# Patient Record
Sex: Female | Born: 1990
Health system: Southern US, Community
[De-identification: ages and names within clinical notes are randomized; demographics above are authoritative.]

## PROBLEM LIST (undated history)

## (undated) DIAGNOSIS — R55 Syncope and collapse: Secondary | ICD-10-CM

## (undated) HISTORY — DX: Syncope and collapse: R55

---
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
## 2009-02-23 ENCOUNTER — Other Ambulatory Visit: Admission: RE | Admit: 2009-02-23 | Discharge: 2009-02-23 | Payer: Self-pay | Admitting: Obstetrics and Gynecology

## 2010-03-24 ENCOUNTER — Other Ambulatory Visit: Admission: RE | Admit: 2010-03-24 | Discharge: 2010-03-24 | Payer: Self-pay | Admitting: Obstetrics and Gynecology

## 2011-01-26 LAB — CBC
HCT: 41.1
Hemoglobin: 11.3 — ABNORMAL LOW
MCHC: 34.5
Platelets: 167
RBC: 3.59 — ABNORMAL LOW
RBC: 4.58
WBC: 10.6 — ABNORMAL HIGH

## 2011-01-26 LAB — RPR: RPR Ser Ql: NONREACTIVE

## 2016-04-21 DIAGNOSIS — H6691 Otitis media, unspecified, right ear: Secondary | ICD-10-CM | POA: Diagnosis not present

## 2016-05-16 DIAGNOSIS — R51 Headache: Secondary | ICD-10-CM | POA: Diagnosis not present

## 2016-06-05 DIAGNOSIS — H9201 Otalgia, right ear: Secondary | ICD-10-CM | POA: Diagnosis not present

## 2016-07-27 DIAGNOSIS — H6980 Other specified disorders of Eustachian tube, unspecified ear: Secondary | ICD-10-CM | POA: Diagnosis not present

## 2016-07-27 DIAGNOSIS — J309 Allergic rhinitis, unspecified: Secondary | ICD-10-CM | POA: Diagnosis not present

## 2016-07-27 DIAGNOSIS — K219 Gastro-esophageal reflux disease without esophagitis: Secondary | ICD-10-CM | POA: Diagnosis not present

## 2016-09-27 DIAGNOSIS — K219 Gastro-esophageal reflux disease without esophagitis: Secondary | ICD-10-CM | POA: Diagnosis not present

## 2016-09-27 DIAGNOSIS — R197 Diarrhea, unspecified: Secondary | ICD-10-CM | POA: Diagnosis not present

## 2017-03-14 DIAGNOSIS — J309 Allergic rhinitis, unspecified: Secondary | ICD-10-CM | POA: Diagnosis not present

## 2017-03-14 DIAGNOSIS — H6981 Other specified disorders of Eustachian tube, right ear: Secondary | ICD-10-CM | POA: Diagnosis not present

## 2017-05-22 DIAGNOSIS — Z20828 Contact with and (suspected) exposure to other viral communicable diseases: Secondary | ICD-10-CM | POA: Diagnosis not present

## 2017-05-22 DIAGNOSIS — J069 Acute upper respiratory infection, unspecified: Secondary | ICD-10-CM | POA: Diagnosis not present

## 2017-05-28 DIAGNOSIS — J0100 Acute maxillary sinusitis, unspecified: Secondary | ICD-10-CM | POA: Diagnosis not present

## 2017-09-10 DIAGNOSIS — Z01419 Encounter for gynecological examination (general) (routine) without abnormal findings: Secondary | ICD-10-CM | POA: Diagnosis not present

## 2017-09-10 DIAGNOSIS — Z6823 Body mass index (BMI) 23.0-23.9, adult: Secondary | ICD-10-CM | POA: Diagnosis not present

## 2017-09-11 DIAGNOSIS — R1011 Right upper quadrant pain: Secondary | ICD-10-CM | POA: Diagnosis not present

## 2017-09-13 DIAGNOSIS — R1011 Right upper quadrant pain: Secondary | ICD-10-CM | POA: Diagnosis not present

## 2017-09-25 DIAGNOSIS — N941 Unspecified dyspareunia: Secondary | ICD-10-CM | POA: Diagnosis not present

## 2017-09-25 DIAGNOSIS — N924 Excessive bleeding in the premenopausal period: Secondary | ICD-10-CM | POA: Diagnosis not present

## 2017-12-27 DIAGNOSIS — L858 Other specified epidermal thickening: Secondary | ICD-10-CM | POA: Diagnosis not present

## 2017-12-27 DIAGNOSIS — D1801 Hemangioma of skin and subcutaneous tissue: Secondary | ICD-10-CM | POA: Diagnosis not present

## 2017-12-27 DIAGNOSIS — L720 Epidermal cyst: Secondary | ICD-10-CM | POA: Diagnosis not present

## 2017-12-27 DIAGNOSIS — D225 Melanocytic nevi of trunk: Secondary | ICD-10-CM | POA: Diagnosis not present

## 2018-04-17 DIAGNOSIS — J3089 Other allergic rhinitis: Secondary | ICD-10-CM | POA: Diagnosis not present

## 2018-04-17 DIAGNOSIS — H6981 Other specified disorders of Eustachian tube, right ear: Secondary | ICD-10-CM | POA: Insufficient documentation

## 2018-05-15 DIAGNOSIS — N390 Urinary tract infection, site not specified: Secondary | ICD-10-CM | POA: Diagnosis not present

## 2018-05-15 DIAGNOSIS — R82998 Other abnormal findings in urine: Secondary | ICD-10-CM | POA: Diagnosis not present

## 2018-05-15 DIAGNOSIS — N760 Acute vaginitis: Secondary | ICD-10-CM | POA: Diagnosis not present

## 2019-02-25 DIAGNOSIS — Z23 Encounter for immunization: Secondary | ICD-10-CM | POA: Diagnosis not present

## 2019-05-07 ENCOUNTER — Emergency Department (HOSPITAL_BASED_OUTPATIENT_CLINIC_OR_DEPARTMENT_OTHER)
Admission: EM | Admit: 2019-05-07 | Discharge: 2019-05-07 | Disposition: A | Discharge status: Home / Self Care | Payer: BC Managed Care – PPO | Attending: Emergency Medicine | Admitting: Emergency Medicine

## 2019-05-07 ENCOUNTER — Other Ambulatory Visit: Payer: Self-pay

## 2019-05-07 ENCOUNTER — Encounter (HOSPITAL_BASED_OUTPATIENT_CLINIC_OR_DEPARTMENT_OTHER): Payer: Self-pay

## 2019-05-07 DIAGNOSIS — R55 Syncope and collapse: Secondary | ICD-10-CM | POA: Insufficient documentation

## 2019-05-07 LAB — URINALYSIS, MICROSCOPIC (REFLEX)

## 2019-05-07 LAB — PREGNANCY, URINE: Preg Test, Ur: NEGATIVE

## 2019-05-07 LAB — URINALYSIS, ROUTINE W REFLEX MICROSCOPIC
Bilirubin Urine: NEGATIVE
Glucose, UA: NEGATIVE mg/dL
Ketones, ur: NEGATIVE mg/dL
Leukocytes,Ua: NEGATIVE
Nitrite: NEGATIVE
Protein, ur: NEGATIVE mg/dL
Specific Gravity, Urine: 1.01 (ref 1.005–1.030)
pH: 7 (ref 5.0–8.0)

## 2019-05-07 LAB — CBC
HCT: 46 % (ref 36.0–46.0)
Hemoglobin: 14.8 g/dL (ref 12.0–15.0)
MCH: 28 pg (ref 26.0–34.0)
MCHC: 32.2 g/dL (ref 30.0–36.0)
MCV: 87 fL (ref 80.0–100.0)
Platelets: 248 10*3/uL (ref 150–400)
RBC: 5.29 MIL/uL — ABNORMAL HIGH (ref 3.87–5.11)
RDW: 12.2 % (ref 11.5–15.5)
WBC: 5.5 10*3/uL (ref 4.0–10.5)
nRBC: 0 % (ref 0.0–0.2)

## 2019-05-07 LAB — BASIC METABOLIC PANEL
Anion gap: 8 (ref 5–15)
BUN: 7 mg/dL (ref 6–20)
CO2: 22 mmol/L (ref 22–32)
Calcium: 9.1 mg/dL (ref 8.9–10.3)
Chloride: 107 mmol/L (ref 98–111)
Creatinine, Ser: 0.7 mg/dL (ref 0.44–1.00)
GFR calc Af Amer: >60 mL/min (ref >60)
GFR calc non Af Amer: >60 mL/min (ref >60)
Glucose, Bld: 143 mg/dL — ABNORMAL HIGH (ref 70–99)
Potassium: 3 mmol/L — ABNORMAL LOW (ref 3.5–5.1)
Sodium: 137 mmol/L (ref 135–145)

## 2019-05-07 LAB — CBG MONITORING, ED: Glucose-Capillary: 129 mg/dL — ABNORMAL HIGH (ref 70–99)

## 2019-05-07 LAB — D-DIMER, QUANTITATIVE (NOT AT ARMC): D-Dimer, Quant: 0.36 ug/mL-FEU (ref 0.00–0.50)

## 2019-05-07 MED ORDER — SODIUM CHLORIDE 0.9 % IV BOLUS
1000.0000 mL | Freq: Once | INTRAVENOUS | Status: AC
  Administered 2019-05-07: 1000 mL via INTRAVENOUS

## 2019-05-07 MED ORDER — SODIUM CHLORIDE 0.9% FLUSH
3.0000 mL | Freq: Once | INTRAVENOUS | Status: AC
  Administered 2019-05-07: 3 mL via INTRAVENOUS
  Filled 2019-05-07: qty 3

## 2019-05-07 NOTE — ED Notes (Signed)
ED Provider at bedside. 

## 2019-05-07 NOTE — Progress Notes (Signed)
   Asked to review EKG on Ms. Voges for exercise induced syncope. Noted to be tachycardic with long QTc on arrival, however, told by EDP Belfi that the QTc normalized with improved rate. No history of syncope. No associated chest pain. No medications. Would recommend outpatient cardiology follow-up with any provider.  Pixie Casino, MD, [facility], [place] Director of the Advanced Lipid Disorders &  Cardiovascular Risk Reduction Clinic Diplomate of the American Board of Clinical Lipidology Attending Cardiologist  Direct Dial: [phone]  Fax: [phone]  Website:  [email or web address]

## 2019-05-07 NOTE — ED Triage Notes (Signed)
Pt states she passed out after a cardio work out ~330pm-states she recalled sitting on ottoman and woke up on the floor-pt NAD-steady gait

## 2019-05-07 NOTE — ED Provider Notes (Signed)
[facility] EMERGENCY DEPARTMENT Provider Note   CSN: [id number] Arrival date & time: 05/07/19  1708     History Chief Complaint  Patient presents with  . Loss of Consciousness     Paone is a 28 y.o. female.  Patient is a 28 year old female with no significant past medical history who presents with a syncopal episode.  She states that she was exercising for the first time in a long time.  She was doing some cardio workout.  She was doing this for about 5 to 8 minutes.  She said that after the exercise, she felt like she normally would after exercising that she was sweaty and her heart was beating fast.  However then she felt like she needed to sit down and that something was not quite right although she could not put her finger on it.  She did not really say that she got lightheaded or dizzy.  She did not feel like her heart was racing anymore than it was after she would normally exercise.  She then woke up on the floor after she had a syncopal event.  Her mother found her and said that she was only unconscious for a minute or 2.  There was not any noted seizure activity.  She woke up and she was diaphoretic.  She felt very fatigued.  She denies any prior history of issues with exercise although she says she does not exercise very much.  She occasionally feels like her heart is racing but only during anxiety attacks.  She denies any other recent illnesses.        History reviewed. No pertinent past medical history.  There are no problems to display for this patient.   History reviewed. No pertinent surgical history.   OB History   No obstetric history on file.     No family history on file.  Social History   Tobacco Use  . Smoking status: Never Smoker  . Smokeless tobacco: Never Used  Substance Use Topics  . Alcohol use: Yes    Comment: occ  . Drug use: Never    Home Medications Prior to Admission medications   Not on File    Allergies      Patient has no known allergies.  Review of Systems   Review of Systems  Constitutional: Positive for diaphoresis and fatigue. Negative for chills and fever.  HENT: Negative for congestion, rhinorrhea and sneezing.   Eyes: Negative.   Respiratory: Negative for cough, chest tightness and shortness of breath.   Cardiovascular: Positive for palpitations. Negative for chest pain and leg swelling.  Gastrointestinal: Negative for abdominal pain, blood in stool, diarrhea, nausea and vomiting.  Genitourinary: Negative for difficulty urinating, flank pain, frequency and hematuria.  Musculoskeletal: Negative for arthralgias and back pain.  Skin: Negative for rash.  Neurological: Positive for syncope. Negative for dizziness, speech difficulty, weakness, numbness and headaches.    Physical Exam Updated Vital Signs BP 131/81 (BP Location: Right Arm)   Pulse (!) 119   Temp 98.7 F (37.1 C) (Oral)   Resp 18   Ht 5\' 3"  (1.6 m)   Wt 60.8 kg   LMP 04/16/2019   SpO2 100%   BMI 23.74 kg/m   Physical Exam Constitutional:      Appearance: She is well-developed.  HENT:     Head: Normocephalic and atraumatic.  Eyes:     Pupils: Pupils are equal, round, and reactive to light.  Cardiovascular:     Rate and  Rhythm: Regular rhythm. Tachycardia present.     Heart sounds: Normal heart sounds.  Pulmonary:     Effort: Pulmonary effort is normal. No respiratory distress.     Breath sounds: Normal breath sounds. No wheezing or rales.  Chest:     Chest wall: No tenderness.  Abdominal:     General: Bowel sounds are normal.     Palpations: Abdomen is soft.     Tenderness: There is no abdominal tenderness. There is no guarding or rebound.  Musculoskeletal:        General: Normal range of motion.     Cervical back: Normal range of motion and neck supple.  Lymphadenopathy:     Cervical: No cervical adenopathy.  Skin:    General: Skin is warm and dry.     Findings: No rash.  Neurological:      General: No focal deficit present.     Mental Status: She is alert and oriented to person, place, and time.     Cranial Nerves: No cranial nerve deficit.     Sensory: No sensory deficit.     Motor: No weakness.     Coordination: Coordination normal.     ED Results / Procedures / Treatments   Labs (all labs ordered are listed, but only abnormal results are displayed) Labs Reviewed  BASIC METABOLIC PANEL - Abnormal; Notable for the following components:      Result Value   Potassium 3.0 (*)    Glucose, Bld 143 (*)    All other components within normal limits  CBC - Abnormal; Notable for the following components:   RBC 5.29 (*)    All other components within normal limits  URINALYSIS, ROUTINE W REFLEX MICROSCOPIC - Abnormal; Notable for the following components:   Hgb urine dipstick SMALL (*)    All other components within normal limits  URINALYSIS, MICROSCOPIC (REFLEX) - Abnormal; Notable for the following components:   Bacteria, UA MANY (*)    All other components within normal limits  CBG MONITORING, ED - Abnormal; Notable for the following components:   Glucose-Capillary 129 (*)    All other components within normal limits  PREGNANCY, URINE  D-DIMER, QUANTITATIVE (NOT AT [facility])  TSH    EKG EKG Interpretation  Date/Time:  Wednesday May 07 2019 18:17:32 EST Ventricular Rate:  97 PR Interval:  144 QRS Duration: 91 QT Interval:  315 QTC Calculation: 401 R Axis:   75 Text Interpretation: Sinus rhythm Borderline T abnormalities, anterior leads Confirmed by Rolan Bucco [phone]) on 05/07/2019 9:53:15 PM   Radiology No results found.  Procedures Procedures (including critical care time)  Medications Ordered in ED Medications  sodium chloride flush (NS) 0.9 % injection 3 mL (3 mLs Intravenous Given 05/07/19 1742)  sodium chloride 0.9 % bolus 1,000 mL (0 mLs Intravenous Stopped 05/07/19 1918)  sodium chloride 0.9 % bolus 1,000 mL (0 mLs Intravenous Stopped  05/07/19 2110)    ED Course  I have reviewed the triage vital signs and the nursing notes.  Pertinent labs & imaging results that were available during my care of the patient were reviewed by me and considered in my medical decision making (see chart for details).    MDM Rules/Calculators/A&P                     Patient is a 28 year old female with a history of anxiety who presents after syncopal event.  It was exercise-induced.  Her initial EKG showed tachycardia with a sinus  tachycardia at a rate of 139.  There was a prolonged QTC.  However repeat EKG showed a normal heart rate with a normal QTC.  No arrhythmias were noted.  I did speak with the cardiologist on-call who reviewed the EKGs and felt that there was not any concerning findings.  Patient can be discharged home with follow-up with cardiology.  Her labs are nonconcerning.  She was tachycardic in the ED although this resolved when patient was alone in the room and we can watch her heart on the monitor.  Her heart rate stayed in the 90s however when anyone would walk in the room her heart rate would go up again.  She does feel like this is related to anxiety as she is anxious about being here in the emergency room.  I did order a TSH.  Her D-dimer was also normal.  Her TSH is pending.  However given the heart rate normalizes when patient is calm and in the room by herself, I do not feel that she needs any further observation regarding this.  She is otherwise asymptomatic.  She was discharged home in good condition.  She was encouraged to refrain from physical exertion until cleared by cardiology.  Return precautions were given.  She was given a referral [facility] although her father sees one of the [facility]ologist here at med center and I gave the patient his contact information as well. Final Clinical Impression(s) / ED Diagnoses Final diagnoses:  Syncope and collapse    Rx / DC Orders ED Discharge  Orders    None       Rolan BuccoBelfi, Karra Pink, MD 05/07/19 2207

## 2019-05-07 NOTE — Discharge Instructions (Addendum)
Refrain from exercise or significant exertion until you are cleared by cardiology.  Follow-up with cardiology as directed.  Return here as needed if you have any worsening symptoms.

## 2019-05-08 LAB — TSH: TSH: 1.621 u[IU]/mL (ref 0.350–4.500)

## 2019-05-19 NOTE — Progress Notes (Signed)
Cardiology Office Note:    Date:  05/20/2019   ID:  Melinda Vasquez, DOB October 30, 1990, MRN [id number]  PCP:  [facility] [place]  Cardiologist:  Norman Herrlich, MD   Referring MD: [facility]  ASSESSMENT:    1. Syncope and collapse   2. Hypotension, unspecified hypotension type    PLAN:    In order of problems listed above:  1. She had a very typical postural syncopal episode related to physical activity abrupt immobilization vasodilation.  She has no evidence of underlying heart disease.  Discussed with her in the future she should gradually cool down continue to walk and should focus on hydration prior to physical activity.  We discussed further evaluation of the ambulatory heart rhythm monitor she expressed concerns about her bills from the emergency room and instead of doing that we decided that she would follow-up with me in the office in 3 months and contact me should another episode.  I do not think she requires imaging like an echocardiogram and she takes no provocative medications. 2. Not uncommon in young healthy women.  Asked her to pay close attention to hydration especially prior to physical activity gradual cooldown and add salt to her diet.  Next appointment 3 months or sooner if she has recurrent cardiovascular symptoms   Medication Adjustments/Labs and Tests Ordered: Current medicines are reviewed at length with the patient today.  Concerns regarding medicines are outlined above.  Orders Placed This Encounter  Procedures  . EKG 12-Lead   No orders of the defined types were placed in this encounter.    Chief Complaint  Patient presents with  . Loss of Consciousness  . New Patient (Initial Visit)    History of Present Illness:    Melinda Vasquez is a 29 y.o. female who is being seen today for the evaluation of syncope at the request of Dr Fredderick [facility] ED after an episode of syncope.  It occurred after physical  exercise.  Was described as brief was no seizure activity and afterwards she was described as diaphoretic and fatigued. EKG performed shows sinus tachycardia heart rate of 139 bpm nonspecific ST depression.  Can EKG showed sinus rhythm minor nonspecific ST abnormality.  Laboratory chest showed a normal D-dimer for age serum potassium 3.0 normal GFR and creatinine CBC showed a hemoglobin of 14.8 pregnancy test negative urinalysis showed many bacteria as well as white cells.  A TSH subsequently reported was normal and there is a note that there was a discussion with cardiology in view of the fact that tachycardia quickly resolved vision was made to discharge for outpatient evaluation.  Hypokalemia abnormality in the the UA were not addressed.  I reviewed her history with her she had not been exercising for many months decided to do a cardio workout on her home push herself hard when she stopped her heart was racing she stood still and started to feel weak and lightheaded.  She decided to sit down on the ottoman slumped over briefly lost consciousness did not have a seizure urinary incontinence or postictal symptoms.  She quickly recovered but found her self very apprehensive and decided to go the emergency room.  She has never had a previous episode the episode did not occur during physical effort.  She has no family history of sudden death or cardiomyopathy.  Her father is anticoagulated likely atrial fibrillation has a history of CAD.  Afterwards her baseline anxiety was worsened she said she  was quite aware of her heart in general but the symptoms have resolved and she has had no recurrent lightheadedness.  Repeat blood pressure by me in my office with proper posturing was 108/76 sitting and 98/80 standing.  Past Medical History:  Diagnosis Date  . Syncope     History reviewed. No pertinent surgical history.  Current Medications: Current Meds  Medication Sig  . cetirizine (ZYRTEC) 10 MG tablet Take  by mouth.  Marland Kitchen NUVARING 0.12-0.015 MG/24HR vaginal ring INSERT 1 RING VAGINALLY FOR 3 WEEKS REMOVE FOR 1 WEEK THEN REPEAT  . triamcinolone (NASACORT) 55 MCG/ACT AERO nasal inhaler Place into the nose as needed.     Allergies:   Patient has no known allergies.   Social History   Socioeconomic History  . Marital status: Single    Spouse name: Not on file  . Number of children: Not on file  . Years of education: Not on file  . Highest education level: Not on file  Occupational History  . Not on file  Tobacco Use  . Smoking status: Never Smoker  . Smokeless tobacco: Never Used  Substance and Sexual Activity  . Alcohol use: Yes    Comment: occ  . Drug use: Never  . Sexual activity: Not on file  Other Topics Concern  . Not on file  Social History Narrative  . Not on file   Social Determinants of Health   Financial Resource Strain:   . Difficulty of Paying Living Expenses: Not on file  Food Insecurity:   . Worried About Charity fundraiser in the Last Year: Not on file  . Ran Out of Food in the Last Year: Not on file  Transportation Needs:   . Lack of Transportation (Medical): Not on file  . Lack of Transportation (Non-Medical): Not on file  Physical Activity:   . Days of Exercise per Week: Not on file  . Minutes of Exercise per Session: Not on file  Stress:   . Feeling of Stress : Not on file  Social Connections:   . Frequency of Communication with Friends and Family: Not on file  . Frequency of Social Gatherings with Friends and Family: Not on file  . Attends Religious Services: Not on file  . Active Member of Clubs or Organizations: Not on file  . Attends Archivist Meetings: Not on file  . Marital Status: Not on file     Family History: The patient's family history includes CAD in her father.  ROS:   Review of Systems  Constitution: Negative.  HENT: Negative.   Eyes: Negative.   Cardiovascular: Positive for palpitations and syncope.  Respiratory:  Negative.   Endocrine: Negative.   Hematologic/Lymphatic: Negative.   Skin: Negative.   Musculoskeletal: Negative.   Gastrointestinal: Negative.   Genitourinary: Negative.   Neurological: Negative for seizures.  Psychiatric/Behavioral: The patient is nervous/anxious.   Allergic/Immunologic: Negative.    Please see the history of present illness.     All other systems reviewed and are negative.  EKGs/Labs/Other Studies Reviewed:    The following studies were reviewed today:   EKG:  EKG is EKG #1 in my office sinus tachycardia 132 beats per minute minor ST abnormality related to heart rate KG #2 heart rate 116 bpm normal EKG both personally reviewed and ordered today.  Recent Labs: 05/07/2019: BUN 7; Creatinine, Ser 0.70; Hemoglobin 14.8; Platelets 248; Potassium 3.0; Sodium 137; TSH 1.621  Recent Lipid Panel No results found for: CHOL, TRIG, HDL,  CHOLHDL, VLDL, LDLCALC, LDLDIRECT  Physical Exam:    VS:  BP 136/78 (BP Location: Left Arm, Patient Position: Sitting, Cuff Size: Normal)   Pulse (!) 58   Ht 5\' 3"  (1.6 m)   Wt 137 lb (62.1 kg)   SpO2 99%   BMI 24.27 kg/m     Wt Readings from Last 3 Encounters:  05/20/19 137 lb (62.1 kg)  05/07/19 134 lb (60.8 kg)     GEN:  Well nourished, well developed in no acute distress HEENT: Normal NECK: No JVD; No carotid bruits LYMPHATICS: No lymphadenopathy CARDIAC: RRR, no murmurs, rubs, gallops RESPIRATORY:  Clear to auscultation without rales, wheezing or rhonchi  ABDOMEN: Soft, non-tender, non-distended MUSCULOSKELETAL:  No edema; No deformity  SKIN: Warm and dry NEUROLOGIC:  Alert and oriented x 3 PSYCHIATRIC:  Normal affect     Signed, 05/09/19, MD  05/20/2019 10:06 AM    [facility]

## 2019-05-20 ENCOUNTER — Other Ambulatory Visit: Payer: Self-pay

## 2019-05-20 ENCOUNTER — Encounter: Payer: Self-pay | Admitting: Cardiology

## 2019-05-20 ENCOUNTER — Ambulatory Visit (INDEPENDENT_AMBULATORY_CARE_PROVIDER_SITE_OTHER): Payer: BC Managed Care – PPO | Admitting: Cardiology

## 2019-05-20 VITALS — BP 136/78 | HR 58 | Ht 63.0 in | Wt 137.0 lb

## 2019-05-20 DIAGNOSIS — I959 Hypotension, unspecified: Secondary | ICD-10-CM

## 2019-05-20 DIAGNOSIS — R55 Syncope and collapse: Secondary | ICD-10-CM

## 2019-05-20 NOTE — Patient Instructions (Signed)
Medication Instructions:  None  If you need a refill on your cardiac medications before your next appointment, please call your pharmacy*  Lab Work: None  If you have labs (blood work) drawn today and your tests are completely normal, you will receive your results only by: . MyChart Message (if you have MyChart) OR . A paper copy in the mail If you have any lab test that is abnormal or we need to change your treatment, we will call you to review the results.  Testing/Procedures: None  Follow-Up: At [facility], you and your health needs are our priority.  As part of our continuing mission to provide you with exceptional heart care, we have created designated Provider Care Teams.  These Care Teams include your primary Cardiologist (physician) and Advanced Practice Providers (APPs -  Physician Assistants and Nurse Practitioners) who all work together to provide you with the care you need, when you need it.  Your next appointment:   3 month(s)  The format for your next appointment:   In Person  Provider:   Brian Munley, MD  Other Instructions None   

## 2019-07-18 ENCOUNTER — Ambulatory Visit: Payer: BC Managed Care – PPO

## 2019-07-31 DIAGNOSIS — R202 Paresthesia of skin: Secondary | ICD-10-CM | POA: Diagnosis not present

## 2019-08-17 NOTE — Progress Notes (Signed)
Cardiology Office Note:    Date:  08/18/2019   ID:  Melinda Vasquez, DOB 09/04/1990, MRN [id number]  PCP:  [facility] [place]  Cardiologist:  Shirlee More, MD    Referring MD: [facility]    ASSESSMENT:    1. Syncope and collapse   2. Hypotension, unspecified hypotension type    PLAN:    In order of problems listed above:  1. Improved no further episodes continue to pay close attention to hydration cooling down after exercise adding salt to the diet.  I encouraged her to use products like Gatorade the day she exercises I will see her back in the office as needed in the future   Next appointment: As needed   Medication Adjustments/Labs and Tests Ordered: Current medicines are reviewed at length with the patient today.  Concerns regarding medicines are outlined above.  No orders of the defined types were placed in this encounter.  No orders of the defined types were placed in this encounter.   Chief Complaint  Patient presents with  . Follow-up    3 Months    History of Present Illness:    Melinda Vasquez is a 29 y.o. female with a hx of syncope last seen 05/20/2019.  Her clinical features are very typical of postural syncope.  She had no evidence of underlying heart disease.  She was advised to pay close attention to adequate hydration especially prior to physical activity with a gradual cooldown and with hypotension had salt to her diet and was to be seen in 3 months to assess response. Compliance with diet, lifestyle and medications: Yes  She pays close attention to hydration add salt to her diet has had no further episodes of syncope or near syncope.  She is aware at times that she notices her heart beating with stress and neither of [place] feel she needs further evaluation for arrhythmia.  She has responded well to reassurance.  She will continue avoid over-the-counter proarrhythmic drugs and I gave her a list last visit Past Medical  History:  Diagnosis Date  . Syncope     History reviewed. No pertinent surgical history.  Current Medications: Current Meds  Medication Sig  . cetirizine (ZYRTEC) 10 MG tablet Take by mouth.  Marland Kitchen NUVARING 0.12-0.015 MG/24HR vaginal ring INSERT 1 RING VAGINALLY FOR 3 WEEKS REMOVE FOR 1 WEEK THEN REPEAT  . triamcinolone (NASACORT) 55 MCG/ACT AERO nasal inhaler Place into the nose as needed.     Allergies:   Patient has no known allergies.   Social History   Socioeconomic History  . Marital status: Single    Spouse name: Not on file  . Number of children: Not on file  . Years of education: Not on file  . Highest education level: Not on file  Occupational History  . Not on file  Tobacco Use  . Smoking status: Never Smoker  . Smokeless tobacco: Never Used  Substance and Sexual Activity  . Alcohol use: Yes    Comment: occ  . Drug use: Never  . Sexual activity: Not on file  Other Topics Concern  . Not on file  Social History Narrative  . Not on file   Social Determinants of Health   Financial Resource Strain:   . Difficulty of Paying Living Expenses:   Food Insecurity:   . Worried About Charity fundraiser in the Last Year:   . Arboriculturist in the Last Year:   [employer]  Needs:   . Lack of Transportation (Medical):   Marland Kitchen Lack of Transportation (Non-Medical):   Physical Activity:   . Days of Exercise per Week:   . Minutes of Exercise per Session:   Stress:   . Feeling of Stress :   Social Connections:   . Frequency of Communication with Friends and Family:   . Frequency of Social Gatherings with Friends and Family:   . Attends Religious Services:   . Active Member of Clubs or Organizations:   . Attends Banker Meetings:   Marland Kitchen Marital Status:      Family History: The patient's family history includes CAD in her father. ROS:   Please see the history of present illness.    All other systems reviewed and are negative.  EKGs/Labs/Other Studies  Reviewed:    The following studies were reviewed today:    Recent Labs: 05/07/2019: BUN 7; Creatinine, Ser 0.70; Hemoglobin 14.8; Platelets 248; Potassium 3.0; Sodium 137; TSH 1.621  Recent Lipid Panel No results found for: CHOL, TRIG, HDL, CHOLHDL, VLDL, LDLCALC, LDLDIRECT  Physical Exam:    VS:  BP 100/70 (BP Location: Right Arm, Patient Position: Sitting, Cuff Size: Normal)   Pulse 92   Ht 5\' 3"  (1.6 m)   Wt 144 lb (65.3 kg)   SpO2 98%   BMI 25.51 kg/m     Wt Readings from Last 3 Encounters:  08/18/19 144 lb (65.3 kg)  05/20/19 137 lb (62.1 kg)  05/07/19 134 lb (60.8 kg)     GEN:  Well nourished, well developed in no acute distress HEENT: Normal NECK: No JVD; No carotid bruits LYMPHATICS: No lymphadenopathy CARDIAC: RRR, no murmurs, rubs, gallops RESPIRATORY:  Clear to auscultation without rales, wheezing or rhonchi  ABDOMEN: Soft, non-tender, non-distended MUSCULOSKELETAL:  No edema; No deformity  SKIN: Warm and dry NEUROLOGIC:  Alert and oriented x 3 PSYCHIATRIC:  Normal affect    Signed, 05/09/19, MD  08/18/2019 9:30 AM    [facility]

## 2019-08-18 ENCOUNTER — Other Ambulatory Visit: Payer: Self-pay

## 2019-08-18 ENCOUNTER — Ambulatory Visit: Payer: BC Managed Care – PPO | Admitting: Cardiology

## 2019-08-18 ENCOUNTER — Encounter: Payer: Self-pay | Admitting: Cardiology

## 2019-08-18 VITALS — BP 100/70 | HR 92 | Ht 63.0 in | Wt 144.0 lb

## 2019-08-18 DIAGNOSIS — R55 Syncope and collapse: Secondary | ICD-10-CM | POA: Diagnosis not present

## 2019-08-18 DIAGNOSIS — I959 Hypotension, unspecified: Secondary | ICD-10-CM | POA: Diagnosis not present

## 2019-08-18 NOTE — Patient Instructions (Signed)

## 2019-11-26 DIAGNOSIS — D225 Melanocytic nevi of trunk: Secondary | ICD-10-CM | POA: Diagnosis not present

## 2019-11-26 DIAGNOSIS — D2262 Melanocytic nevi of left upper limb, including shoulder: Secondary | ICD-10-CM | POA: Diagnosis not present

## 2019-11-26 DIAGNOSIS — L858 Other specified epidermal thickening: Secondary | ICD-10-CM | POA: Diagnosis not present

## 2019-11-26 DIAGNOSIS — Z113 Encounter for screening for infections with a predominantly sexual mode of transmission: Secondary | ICD-10-CM | POA: Diagnosis not present

## 2019-11-26 DIAGNOSIS — N760 Acute vaginitis: Secondary | ICD-10-CM | POA: Diagnosis not present

## 2020-01-23 DIAGNOSIS — Z6824 Body mass index (BMI) 24.0-24.9, adult: Secondary | ICD-10-CM | POA: Diagnosis not present

## 2020-01-23 DIAGNOSIS — Z113 Encounter for screening for infections with a predominantly sexual mode of transmission: Secondary | ICD-10-CM | POA: Diagnosis not present

## 2020-01-23 DIAGNOSIS — N760 Acute vaginitis: Secondary | ICD-10-CM | POA: Diagnosis not present

## 2020-01-23 DIAGNOSIS — Z01419 Encounter for gynecological examination (general) (routine) without abnormal findings: Secondary | ICD-10-CM | POA: Diagnosis not present

## 2021-01-05 ENCOUNTER — Other Ambulatory Visit: Payer: Self-pay

## 2021-01-05 ENCOUNTER — Other Ambulatory Visit: Payer: Self-pay | Admitting: Family Medicine

## 2021-01-05 ENCOUNTER — Ambulatory Visit
Admission: RE | Admit: 2021-01-05 | Discharge: 2021-01-05 | Disposition: A | Discharge status: Home / Self Care | Payer: Self-pay | Source: Ambulatory Visit | Attending: Family Medicine | Admitting: Family Medicine

## 2021-01-05 DIAGNOSIS — R52 Pain, unspecified: Secondary | ICD-10-CM

## 2021-05-18 DIAGNOSIS — Z03818 Encounter for observation for suspected exposure to other biological agents ruled out: Secondary | ICD-10-CM | POA: Diagnosis not present

## 2021-05-18 DIAGNOSIS — J019 Acute sinusitis, unspecified: Secondary | ICD-10-CM | POA: Diagnosis not present

## 2021-12-08 DIAGNOSIS — L700 Acne vulgaris: Secondary | ICD-10-CM | POA: Diagnosis not present

## 2021-12-12 DIAGNOSIS — Z6823 Body mass index (BMI) 23.0-23.9, adult: Secondary | ICD-10-CM | POA: Diagnosis not present

## 2021-12-12 DIAGNOSIS — Z01419 Encounter for gynecological examination (general) (routine) without abnormal findings: Secondary | ICD-10-CM | POA: Diagnosis not present

## 2021-12-12 DIAGNOSIS — R69 Illness, unspecified: Secondary | ICD-10-CM | POA: Diagnosis not present

## 2021-12-12 DIAGNOSIS — Z113 Encounter for screening for infections with a predominantly sexual mode of transmission: Secondary | ICD-10-CM | POA: Diagnosis not present

## 2021-12-12 DIAGNOSIS — L700 Acne vulgaris: Secondary | ICD-10-CM | POA: Diagnosis not present

## 2021-12-12 DIAGNOSIS — N898 Other specified noninflammatory disorders of vagina: Secondary | ICD-10-CM | POA: Diagnosis not present

## 2021-12-26 DIAGNOSIS — Z3202 Encounter for pregnancy test, result negative: Secondary | ICD-10-CM | POA: Diagnosis not present

## 2021-12-26 DIAGNOSIS — N760 Acute vaginitis: Secondary | ICD-10-CM | POA: Diagnosis not present

## 2022-01-04 ENCOUNTER — Ambulatory Visit (INDEPENDENT_AMBULATORY_CARE_PROVIDER_SITE_OTHER): Payer: 59 | Admitting: Radiology

## 2022-01-04 ENCOUNTER — Encounter: Payer: Self-pay | Admitting: Radiology

## 2022-01-04 VITALS — BP 114/80 | Ht 62.0 in | Wt 128.0 lb

## 2022-01-04 DIAGNOSIS — N761 Subacute and chronic vaginitis: Secondary | ICD-10-CM | POA: Diagnosis not present

## 2022-01-04 DIAGNOSIS — R69 Illness, unspecified: Secondary | ICD-10-CM | POA: Diagnosis not present

## 2022-01-04 DIAGNOSIS — N760 Acute vaginitis: Secondary | ICD-10-CM

## 2022-01-04 DIAGNOSIS — Z113 Encounter for screening for infections with a predominantly sexual mode of transmission: Secondary | ICD-10-CM

## 2022-01-04 LAB — WET PREP FOR TRICH, YEAST, CLUE

## 2022-01-04 MED ORDER — TERCONAZOLE 0.4 % VA CREA: 1.0000 | TOPICAL_CREAM | Freq: Every day | VAGINAL | 0 refills | Status: DC

## 2022-01-04 MED ORDER — TRIAMCINOLONE ACETONIDE 0.5 % EX OINT: 1.0000 | TOPICAL_OINTMENT | Freq: Two times a day (BID) | CUTANEOUS | 0 refills | Status: DC

## 2022-01-04 NOTE — Progress Notes (Signed)
      Subjective: Melinda Vasquez is a 31 y.o. female who complains of recurrent vaginal infections since 11/2021 (after taking antibiotics, was treated for yeast).  Had negative mycoplasma/ureaplasma this week at [facility].  Had negative GC/CT testing 12/12/21.  Also desires blood work for STD testing (not done at her [facility] 12/12/21 [facility]).  Wants to discuss birth control options.  Was given rx for Nuvaring at her [facility] and was supposed to insert this past Sunday (patient did not insert ring). P 01/23/20    Review of Systems  Past Medical History:  Diagnosis Date   Syncope       Objective:  Today's Vitals   01/04/22 1325  BP: 114/80  Weight: 128 lb (58.1 kg)  Height: 5\' 2"  (1.575 m)   Body mass index is 23.41 kg/m.   -General: no acute distress -Vulva: without lesions or discharge -Vagina: discharge present, aptima swab and wet prep obtained -Cervix: no lesion or discharge, no CMT -Perineum: no lesions -Uterus: Mobile, non tender -Adnexa: no masses or tenderness   Microscopic wet-mount exam shows hyphae.   Chaperone offered and declined.  Assessment:/Plan:   1. Acute vaginitis - WET PREP FOR TRICH, YEAST, CLUE - terconazole (TERAZOL 7) 0.4 % vaginal cream; Place 1 applicator vaginally at bedtime.  Dispense: 45 g; Refill: 0  2. Chronic vaginitis - SureSwab Advanced Candida Vaginitis (CV), TMA - triamcinolone ointment (KENALOG) 0.5 %; Apply 1 Application topically 2 (two) times daily.  Dispense: 30 g; Refill: 0  3. Screening for STDs (sexually transmitted diseases) - Hepatitis C antibody - HIV antibody (with reflex) - RPR    Will contact patient with results of testing completed today. Avoid intercourse until symptoms are resolved. Safe sex encouraged. Avoid the use of soaps or perfumed products in the peri area. Avoid tub baths and sitting in sweaty or wet clothing for prolonged periods of time.

## 2022-01-05 LAB — HEPATITIS C ANTIBODY: Hepatitis C Ab: NONREACTIVE

## 2022-01-05 LAB — SURESWAB® ADVANCED CANDIDA VAGINITIS (CV), TMA
CANDIDA SPECIES: NOT DETECTED
Candida glabrata: NOT DETECTED

## 2022-01-05 LAB — RPR: RPR Ser Ql: NONREACTIVE

## 2022-01-05 LAB — HIV ANTIBODY (ROUTINE TESTING W REFLEX): HIV 1&2 Ab, 4th Generation: NONREACTIVE

## 2022-01-19 ENCOUNTER — Encounter: Payer: Self-pay | Admitting: Nurse Practitioner

## 2022-01-19 ENCOUNTER — Ambulatory Visit (INDEPENDENT_AMBULATORY_CARE_PROVIDER_SITE_OTHER): Payer: 59 | Admitting: Nurse Practitioner

## 2022-01-19 VITALS — BP 112/62 | HR 106

## 2022-01-19 DIAGNOSIS — B3731 Acute candidiasis of vulva and vagina: Secondary | ICD-10-CM | POA: Diagnosis not present

## 2022-01-19 DIAGNOSIS — N760 Acute vaginitis: Secondary | ICD-10-CM

## 2022-01-19 LAB — WET PREP FOR TRICH, YEAST, CLUE

## 2022-01-19 MED ORDER — FLUCONAZOLE 150 MG PO TABS: 150.0000 mg | ORAL_TABLET | ORAL | 0 refills | Status: DC

## 2022-01-19 MED ORDER — TERCONAZOLE 0.8 % VA CREA: 1.0000 | TOPICAL_CREAM | Freq: Every day | VAGINAL | 1 refills | Status: DC

## 2022-01-19 NOTE — Progress Notes (Signed)
   Acute Office Visit  Subjective:    Patient ID: Melinda Vasquez, female    DOB: 23-Apr-1991, 31 y.o.   MRN: [id number]   HPI 31 y.o. presents today for recurrent vaginitis symptoms. Initial symptoms began in July after 2 weeks of ampicillin course for acne. Diflucan x 2 at that time. Symptoms returned - Had negative STD screening and neg yeast and BV testing 12/12/2021, neg mycoplasma/ureaplasma in August as well. Would like mycoplasma/ureaplasma rechecked because she has had ureaplasma before and symptoms are similar. Treated for yeast infection 01/04/22 with Terazol 0.4% and provided with Kenalog ointment. Symptoms improved but then when she returned from the beach she began having symptoms again. She is very frustrated and emotional today. Reports some pain with intercourse the last couple of times.    Review of Systems  Constitutional: Negative.   Genitourinary:  Positive for dyspareunia and vaginal pain (Vulvar). Negative for vaginal discharge.       Objective:    Physical Exam Constitutional:      Appearance: Normal appearance.  Genitourinary:    Vagina: No vaginal discharge or erythema.     Cervix: Normal.     Comments: Mild vulvar erythema    BP 112/62   Pulse (!) 106   LMP 12/27/2021 (Exact Date)   SpO2 98%  Wt Readings from Last 3 Encounters:  01/04/22 128 lb (58.1 kg)  08/18/19 144 lb (65.3 kg)  05/20/19 137 lb (62.1 kg)        Patient informed chaperone available to be present for breast and/or pelvic exam. Patient has requested no chaperone to be present. Patient has been advised what will be completed during breast and pelvic exam.   Wet prep + yeast  Assessment & Plan:   Problem List Items Addressed This Visit   None Visit Diagnoses     Vulvovaginal candidiasis    -  Primary   Relevant Medications   terconazole (TERAZOL 3) 0.8 % vaginal cream   fluconazole (DIFLUCAN) 150 MG tablet   Recurrent vaginitis       Relevant Orders   Mycoplasma / ureaplasma  culture   WET PREP FOR TRICH, YEAST, CLUE      Plan: Wet prep positive for yeast - Terazol 0.8% x 3 nights, then begin Diflucan 150 mg weekly x 8 weeks. Mycoplasma/ureaplasma pending. May use Kenalog as well.      Olivia Mackie DNP, 10:20 AM 01/19/2022

## 2022-01-25 ENCOUNTER — Encounter: Payer: Self-pay | Admitting: Nurse Practitioner

## 2022-01-27 LAB — MYCOPLASMA / UREAPLASMA CULTURE

## 2022-02-01 ENCOUNTER — Encounter: Payer: Self-pay | Admitting: Nurse Practitioner

## 2022-02-08 ENCOUNTER — Ambulatory Visit (INDEPENDENT_AMBULATORY_CARE_PROVIDER_SITE_OTHER): Payer: 59 | Admitting: Radiology

## 2022-02-08 VITALS — BP 110/70

## 2022-02-08 DIAGNOSIS — N761 Subacute and chronic vaginitis: Secondary | ICD-10-CM | POA: Diagnosis not present

## 2022-02-08 LAB — WET PREP FOR TRICH, YEAST, CLUE

## 2022-02-08 NOTE — Progress Notes (Signed)
      Subjective: Melinda Vasquez is a 31 y.o. female who complains of persistent vaginal infection (treated with terconazole twice and Diflucan (once a week x's 8 weeks, has used 3 weeks).  Slight vaginal itching over the weekend, no discharge.  Symptoms are more itching external labial, perineal areas. Did not find the external steroid helped at all. Using dove sensitive skin soap, taking a raw probiotic 3x/day which has improved her gut slightly.  Review of Systems  All other systems reviewed and are negative.   Past Medical History:  Diagnosis Date   Syncope       Objective:  Today's Vitals   02/08/22 1353  BP: 110/70   There is no height or weight on file to calculate BMI.   -General: no acute distress -Vulva: without lesions or discharge -Vagina: scant white discharge present, aptima swab and wet prep obtained -Cervix: no lesion or discharge, no CMT -Perineum: no lesions -Uterus: Mobile, non tender -Adnexa: no masses or tenderness   Microscopic wet-mount exam shows hyphae.   Chaperone offered and declined.  Assessment:/Plan:   1. Subacute vaginitis Boric acid nightly x 7 nights Continue probiotic Will wait for culture to return before starting another antifungal - SureSwab Advanced Candida Vaginitis (CV), TMA    Will contact patient with results of testing completed today. Avoid intercourse until symptoms are resolved. Safe sex encouraged. Avoid the use of soaps or perfumed products in the peri area. Avoid tub baths and sitting in sweaty or wet clothing for prolonged periods of time.

## 2022-02-09 LAB — SURESWAB® ADVANCED CANDIDA VAGINITIS (CV), TMA
CANDIDA SPECIES: NOT DETECTED
Candida glabrata: NOT DETECTED

## 2022-02-17 ENCOUNTER — Telehealth: Payer: Self-pay | Admitting: *Deleted

## 2022-02-17 NOTE — Telephone Encounter (Signed)
Patient called with complains of vaginal discomfort, vaginal itching. I will route to Provider for recommendations. Patient request treatment over phone

## 2022-02-17 NOTE — Telephone Encounter (Signed)
Last testing was negative. Recommend continuing probiotics using baking soda baths and coconut oil to relieve itching.

## 2022-02-17 NOTE — Telephone Encounter (Signed)
Pt aware recommedantions

## 2022-03-03 ENCOUNTER — Ambulatory Visit (INDEPENDENT_AMBULATORY_CARE_PROVIDER_SITE_OTHER): Payer: 59 | Admitting: Radiology

## 2022-03-03 ENCOUNTER — Encounter: Payer: Self-pay | Admitting: Radiology

## 2022-03-03 VITALS — BP 120/80

## 2022-03-03 DIAGNOSIS — N898 Other specified noninflammatory disorders of vagina: Secondary | ICD-10-CM | POA: Diagnosis not present

## 2022-03-03 LAB — WET PREP FOR TRICH, YEAST, CLUE

## 2022-03-03 NOTE — Progress Notes (Signed)
      Subjective: Melinda Vasquez is a 31 y.o. female who complains of some vaginal dryness/ irritation. Has struggled with recurrent infections recently, would like to confirm no infection lingering.    Review of Systems  All other systems reviewed and are negative.   Past Medical History:  Diagnosis Date   Syncope       Objective:  Today's Vitals   03/03/22 0824  BP: 120/80   There is no height or weight on file to calculate BMI.   -General: no acute distress -Vulva: without lesions or discharge -Vagina: discharge present, aptima swab and wet prep obtained -Cervix: no lesion or discharge, no CMT -Perineum: no lesions -Uterus: Mobile, non tender -Adnexa: no masses or tenderness   Microscopic wet-mount exam shows negative for pathogens, normal epithelial cells.   Chaperone offered and declined.  Assessment:/Plan:  1. Vaginal irritation Wet mt neg, reassured Coconut oil prn  Safe sex encouraged. Avoid the use of soaps or perfumed products in the peri area. Avoid tub baths and sitting in sweaty or wet clothing for prolonged periods of time.

## 2022-04-06 ENCOUNTER — Ambulatory Visit (INDEPENDENT_AMBULATORY_CARE_PROVIDER_SITE_OTHER): Payer: 59 | Admitting: Radiology

## 2022-04-06 VITALS — BP 98/68

## 2022-04-06 DIAGNOSIS — N760 Acute vaginitis: Secondary | ICD-10-CM

## 2022-04-06 LAB — WET PREP FOR TRICH, YEAST, CLUE

## 2022-04-06 MED ORDER — ESTRADIOL 0.1 MG/GM VA CREA: 1.0000 | TOPICAL_CREAM | VAGINAL | 1 refills | Status: AC

## 2022-04-06 NOTE — Progress Notes (Signed)
     Subjective: Melinda Vasquez is a 31 y.o. female who complains of some vaginal irritation x 2 weeks. Has struggled with recurrent infections recently, would like to confirm no infection lingering.    Review of Systems  All other systems reviewed and are negative.   Past Medical History:  Diagnosis Date   Syncope       Objective:  Today's Vitals   04/06/22 1350  BP: 98/68   There is no height or weight on file to calculate BMI.   -General: no acute distress -Vulva: without lesions or discharge. Very slight external erythema -Vagina: discharge present, aptima swab and wet prep obtained -Cervix: no lesion or discharge, no CMT -Perineum: no lesions -Uterus: Mobile, non tender -Adnexa: no masses or tenderness   Microscopic wet-mount exam shows negative for pathogens, normal epithelial cells.   Chaperone offered and declined.  Assessment:/Plan:  1. Acute vaginitis Reassured negative - WET PREP FOR TRICH, YEAST, CLUE  2. Vulvovaginitis Will try estrace to prevent recurrent symptoms - estradiol (ESTRACE VAGINAL) 0.1 MG/GM vaginal cream; Place 1 Applicatorful vaginally once a week.  Dispense: 42.5 g; Refill: 1   Safe sex encouraged. Avoid the use of soaps or perfumed products in the peri area. Avoid tub baths and sitting in sweaty or wet clothing for prolonged periods of time.

## 2022-04-11 ENCOUNTER — Ambulatory Visit: Payer: 59 | Admitting: Radiology

## 2022-07-13 DIAGNOSIS — M79674 Pain in right toe(s): Secondary | ICD-10-CM | POA: Diagnosis not present

## 2022-07-13 DIAGNOSIS — S92504A Nondisplaced unspecified fracture of right lesser toe(s), initial encounter for closed fracture: Secondary | ICD-10-CM | POA: Diagnosis not present

## 2023-07-04 IMAGING — CR DG CERVICAL SPINE 2 OR 3 VIEWS
3 series · 3 of 3 positions shown · non-contrast
Comparison: None.

CLINICAL DATA: Neck pain

EXAM:
CERVICAL SPINE - 2-3 VIEW

[w cervical spine lat]
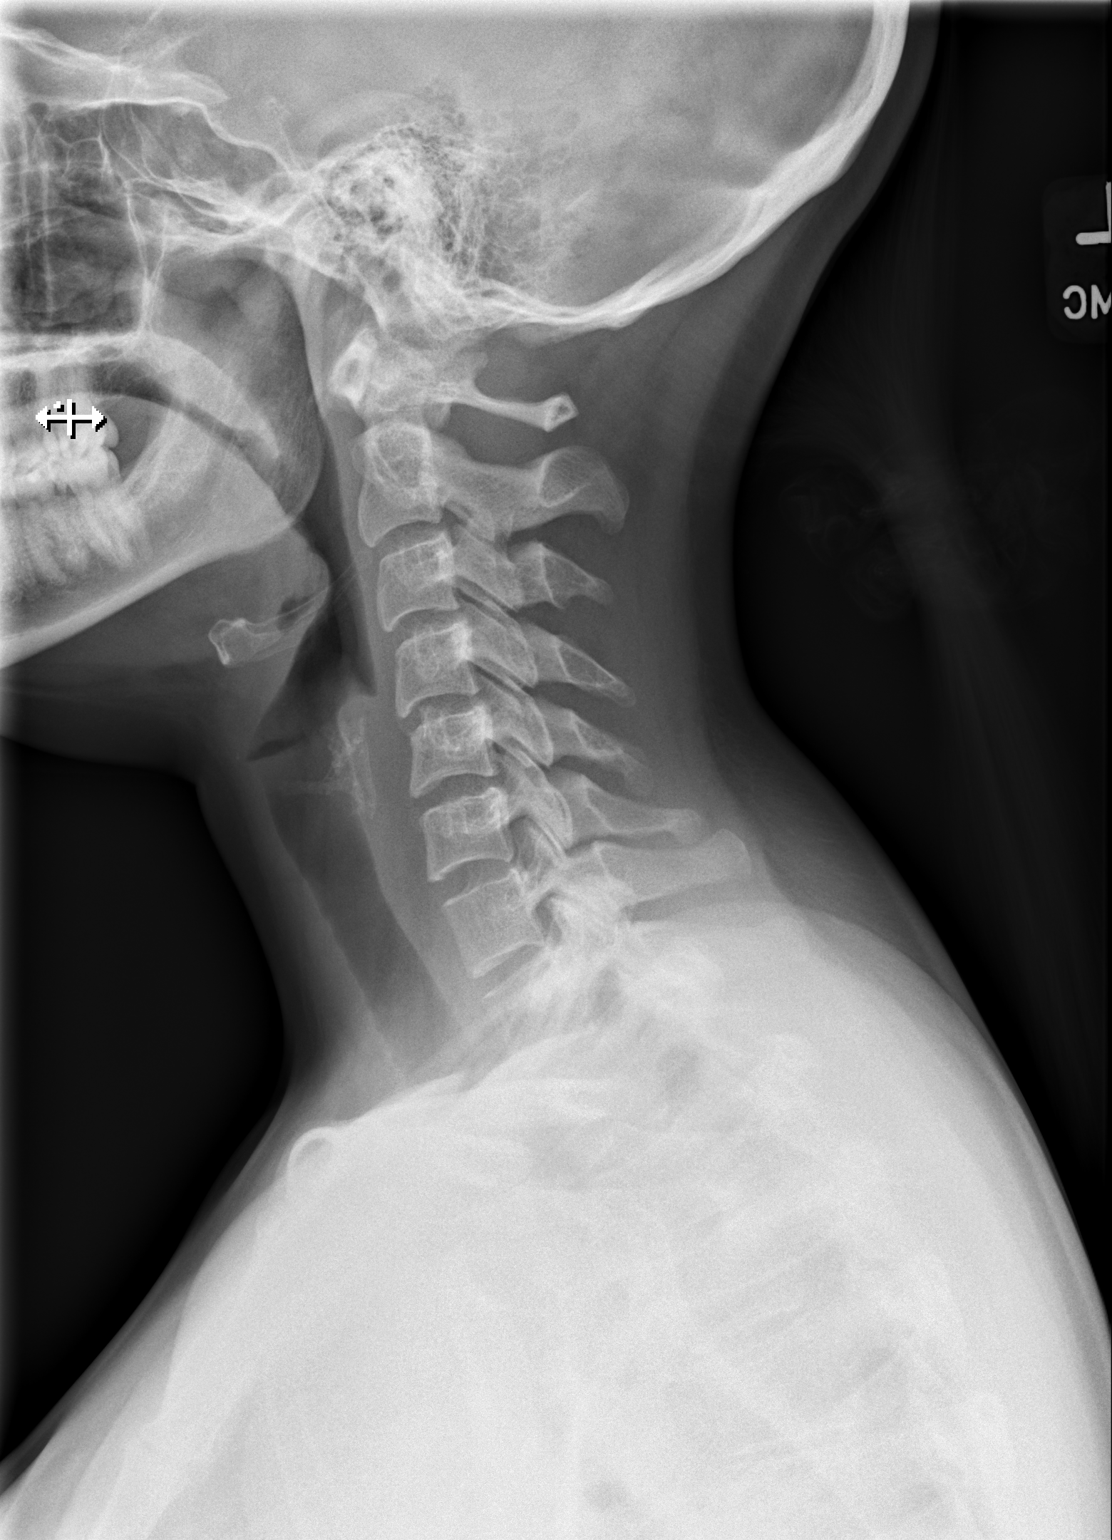

[w cervical spine ap]
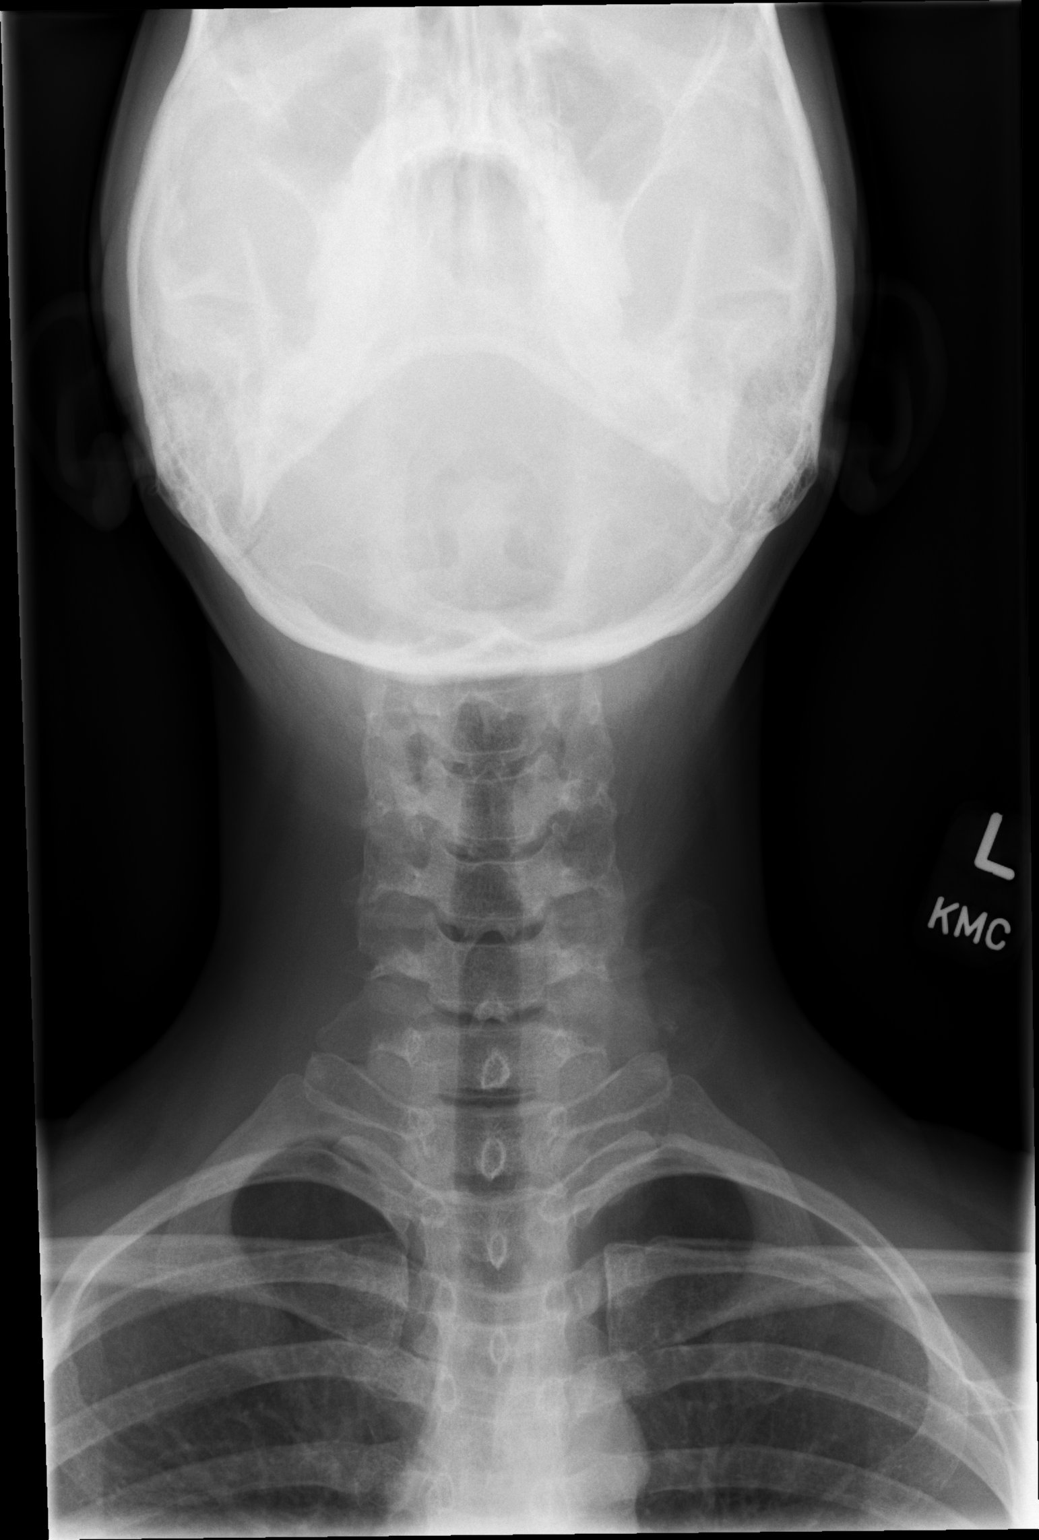

[w cervical spine odontoid]
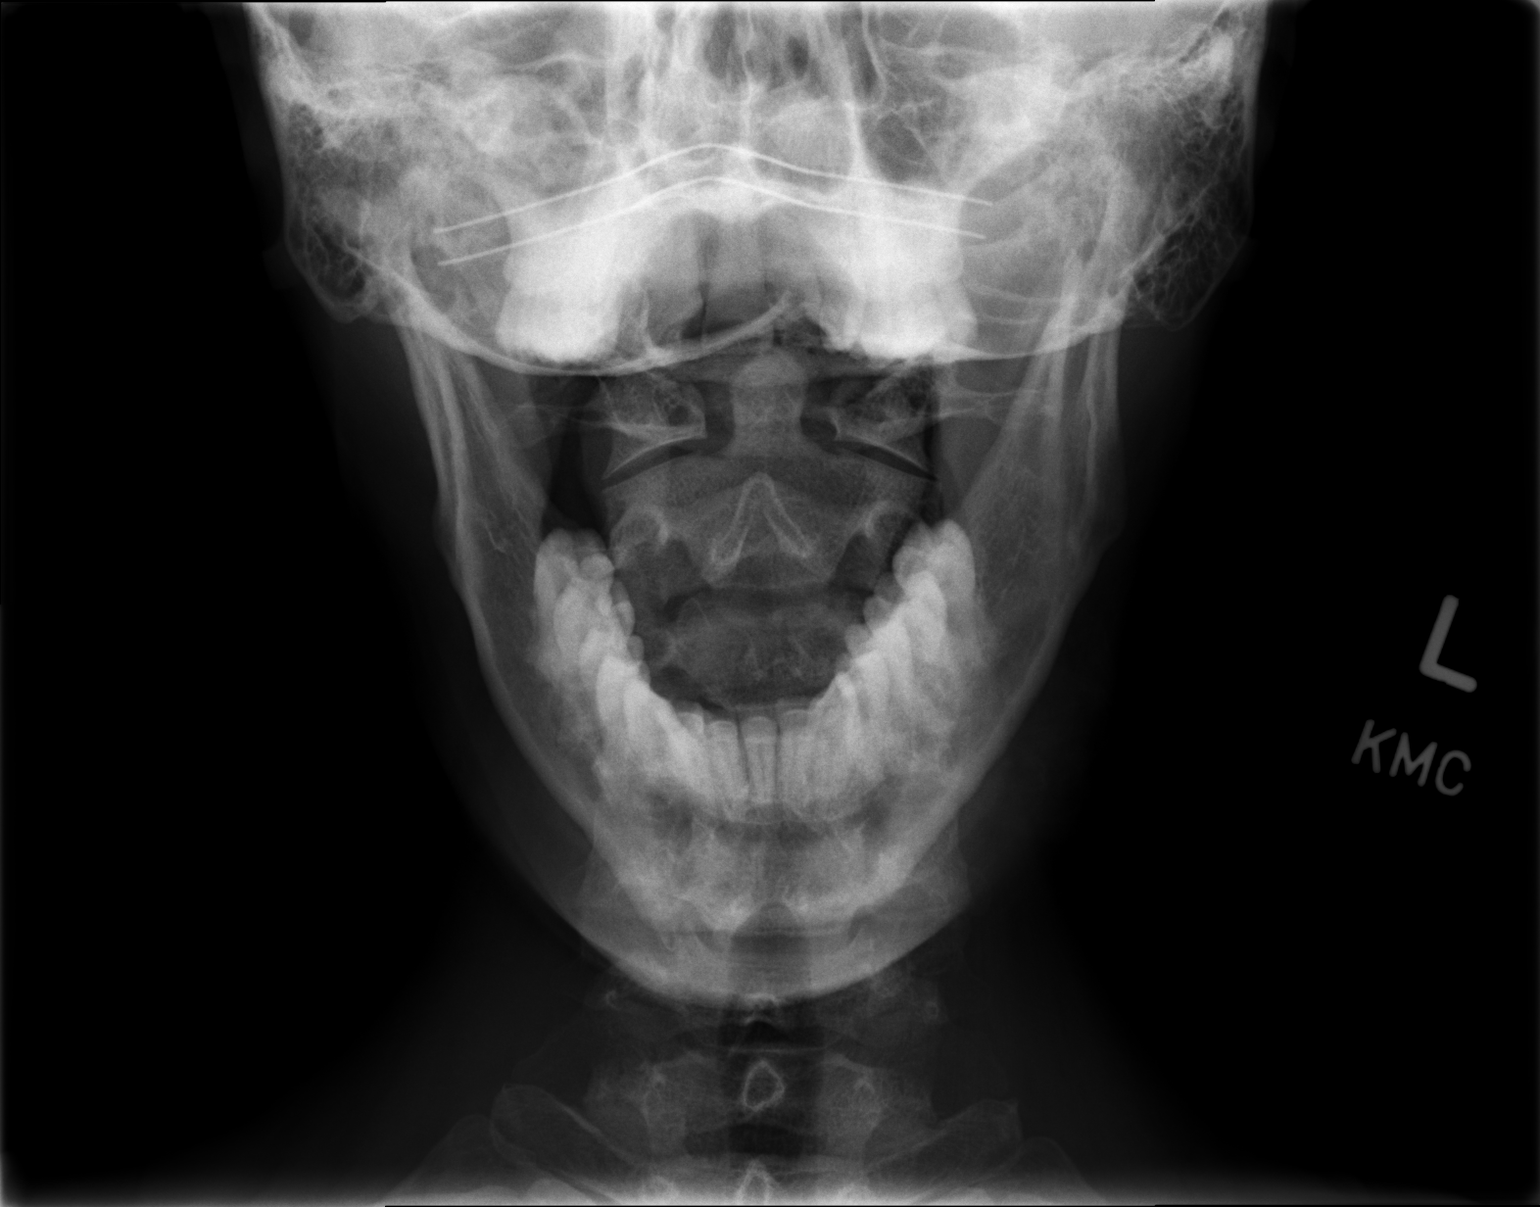

[3 of 3 positions shown; findings below may reference images not displayed]

FINDINGS: There is no evidence of cervical spine fracture or prevertebral soft
tissue swelling. Alignment is normal. No other significant bone
abnormalities are identified.
IMPRESSION: Negative cervical spine radiographs.
